# Patient Record
Sex: Female | Born: 1960 | Race: White | Hispanic: No | Marital: Married
Health system: Southern US, Community
[De-identification: ages and names within clinical notes are randomized; demographics above are authoritative.]

---

## 2019-10-19 ENCOUNTER — Other Ambulatory Visit: Payer: Self-pay | Admitting: Rheumatology

## 2019-10-19 DIAGNOSIS — R9389 Abnormal findings on diagnostic imaging of other specified body structures: Secondary | ICD-10-CM

## 2019-11-01 ENCOUNTER — Other Ambulatory Visit: Payer: Self-pay | Admitting: Rheumatology

## 2019-11-01 ENCOUNTER — Ambulatory Visit
Admission: RE | Admit: 2019-11-01 | Discharge: 2019-11-01 | Disposition: A | Discharge status: Home / Self Care | Payer: Commercial Managed Care - PPO | Source: Ambulatory Visit | Attending: Rheumatology | Admitting: Rheumatology

## 2019-11-01 ENCOUNTER — Other Ambulatory Visit: Payer: Self-pay

## 2019-11-01 DIAGNOSIS — R9389 Abnormal findings on diagnostic imaging of other specified body structures: Secondary | ICD-10-CM | POA: Insufficient documentation

## 2020-12-16 IMAGING — CT CT ABD-PELV W/O CM
2 of 4 series · 16 of 46 positions shown, 18 images · non-contrast
Comparison: None.

CLINICAL DATA: History of von Hippel-Lindau.

EXAM:
CT ABDOMEN AND PELVIS WITHOUT CONTRAST
TECHNIQUE: Multidetector CT imaging of the abdomen and pelvis was performed
following the standard protocol without IV contrast.

[Series 2: axials routine abdomen pelvis without 5.00 · axial · non-contrast · 0.91mm/px · z∈[-1519,-1099]mm · 13 of 98 slices shown, 15 images]
[im 7/98  soft-tissue]
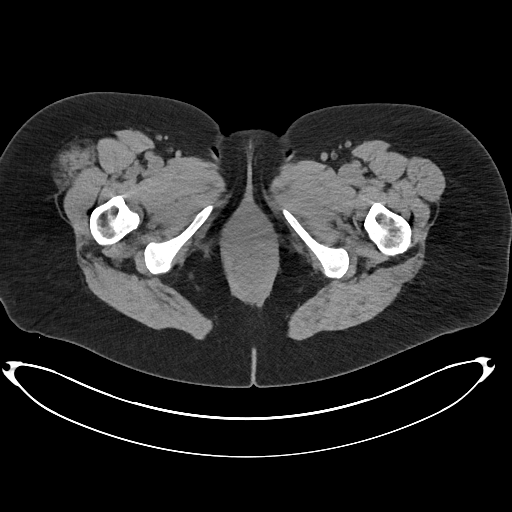
[im 7/98  bone]
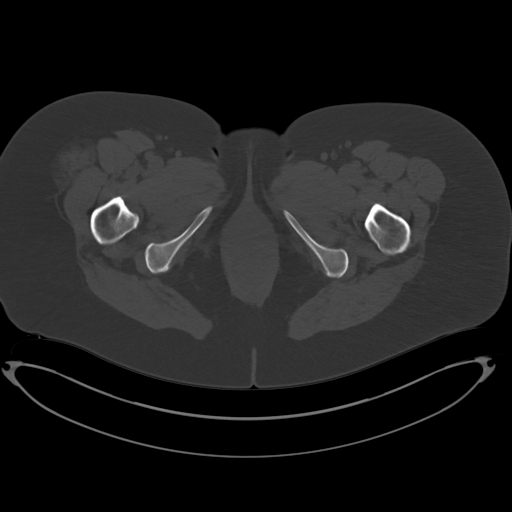
[im 13/98  soft-tissue]
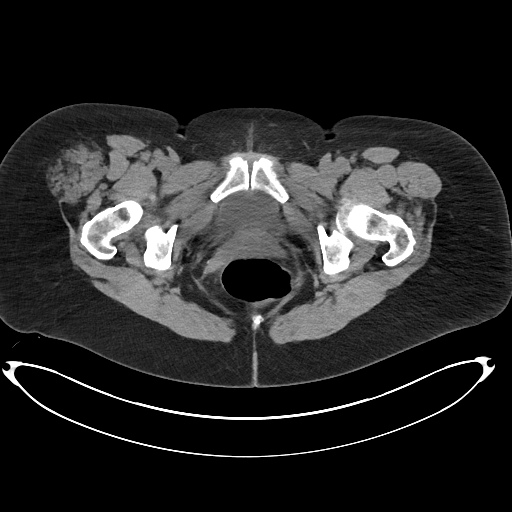
[im 20/98  soft-tissue]
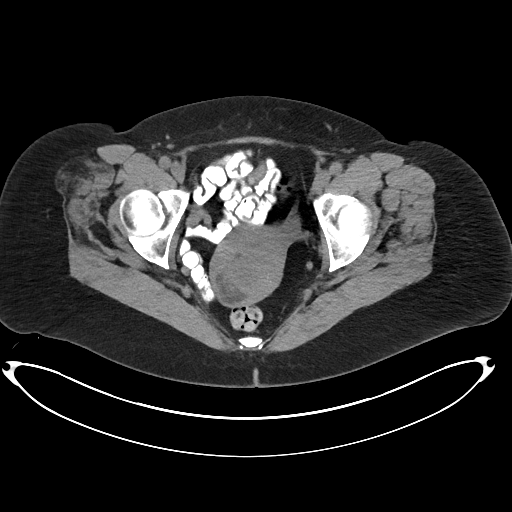
[im 26/98  soft-tissue]
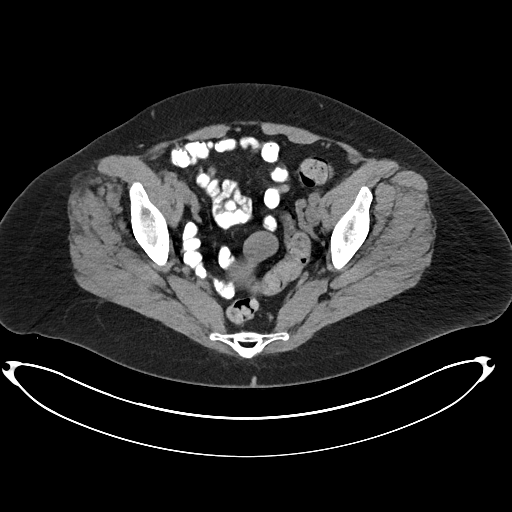
[im 33/98  soft-tissue]
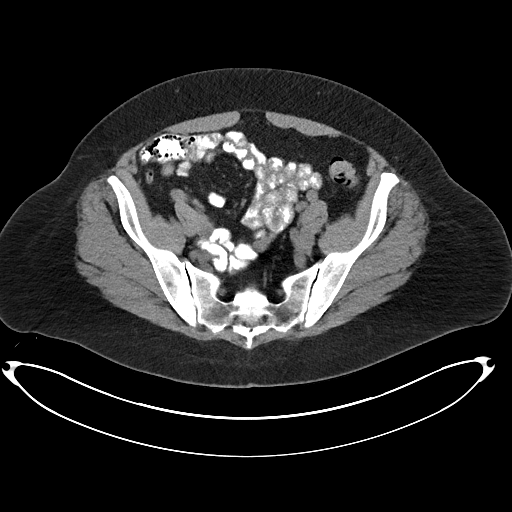
[im 39/98  soft-tissue]
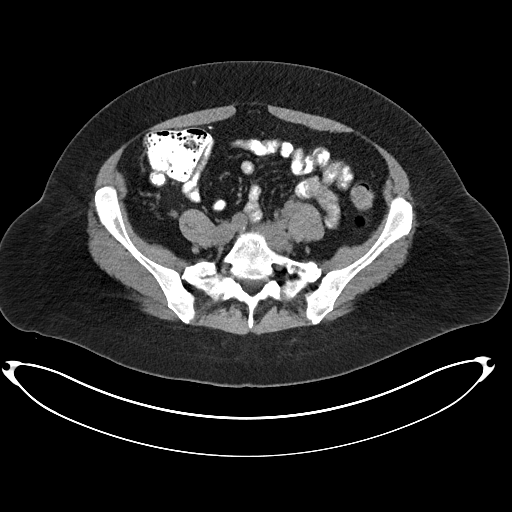
[im 52/98  soft-tissue]
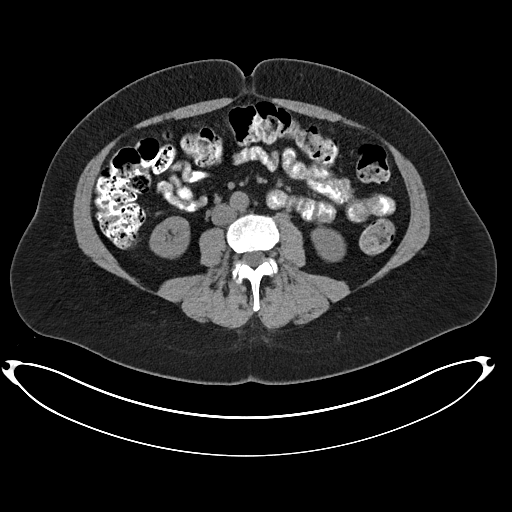
[im 59/98  soft-tissue]
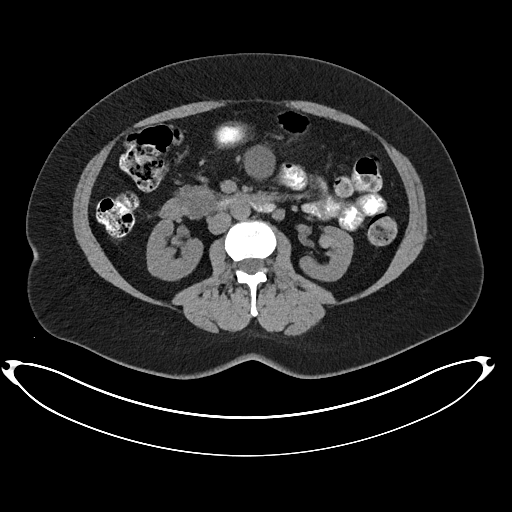
[im 65/98  soft-tissue]
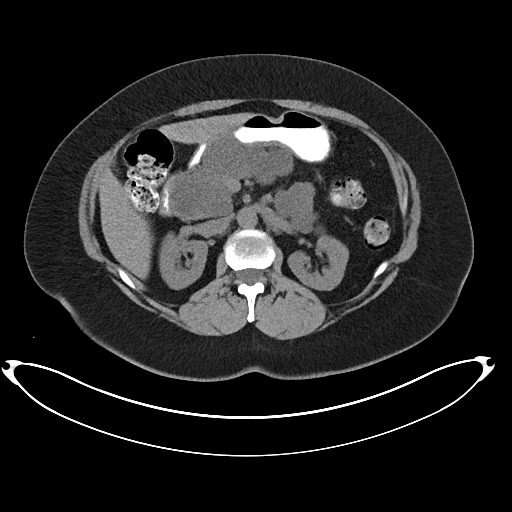
[im 65/98  bone]
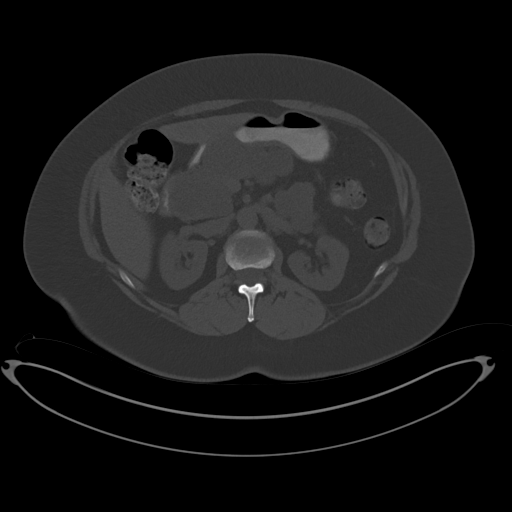
[im 72/98  soft-tissue]
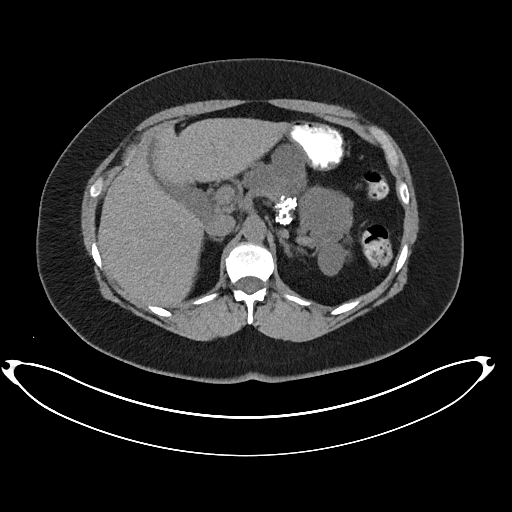
[im 78/98  soft-tissue]
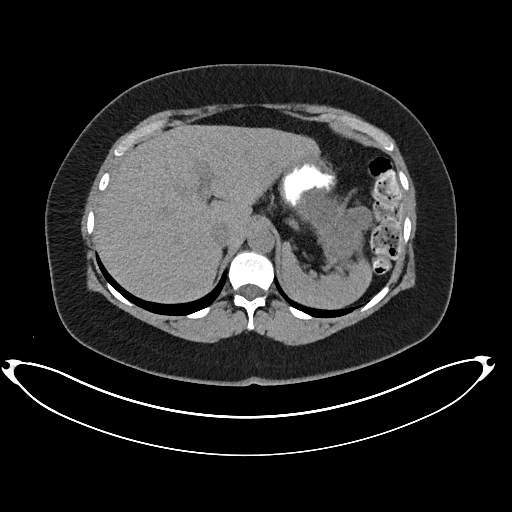
[im 85/98  soft-tissue]
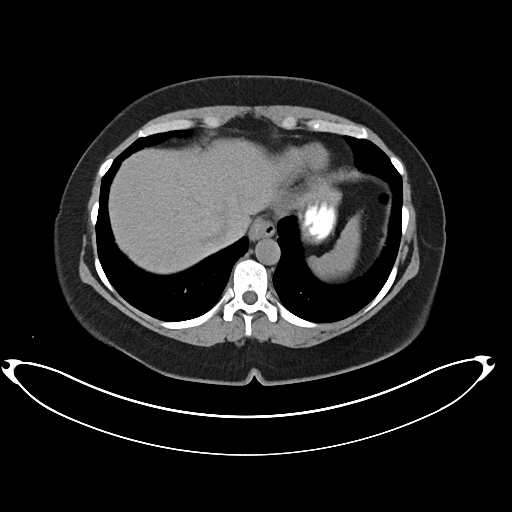
[im 91/98  soft-tissue]
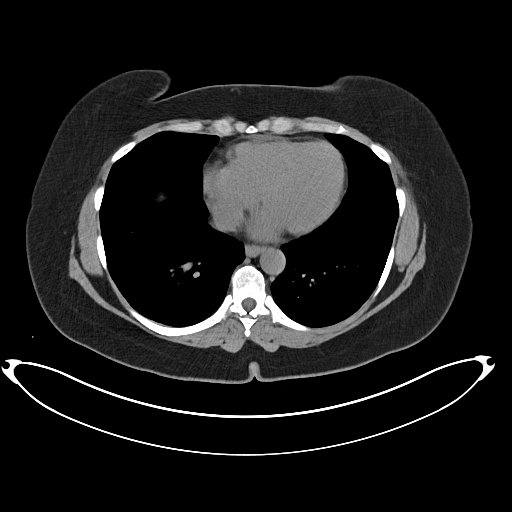

[Series 4: coronals routine abdomen pelvis without 2.00 cor · coronal · non-contrast · 0.91mm/px · 3 of 155 slices shown]
[im 52/155  soft-tissue]
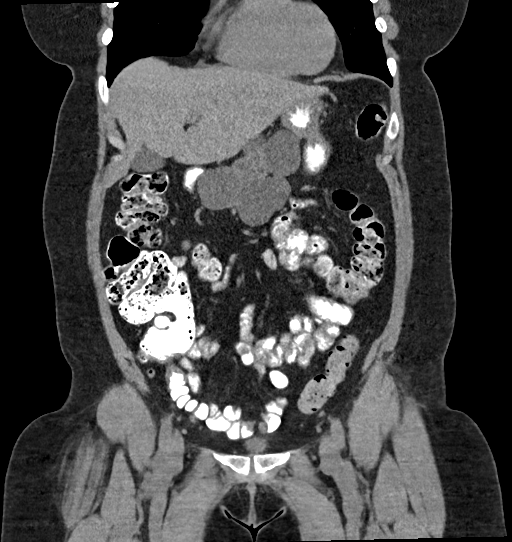
[im 69/155  soft-tissue]
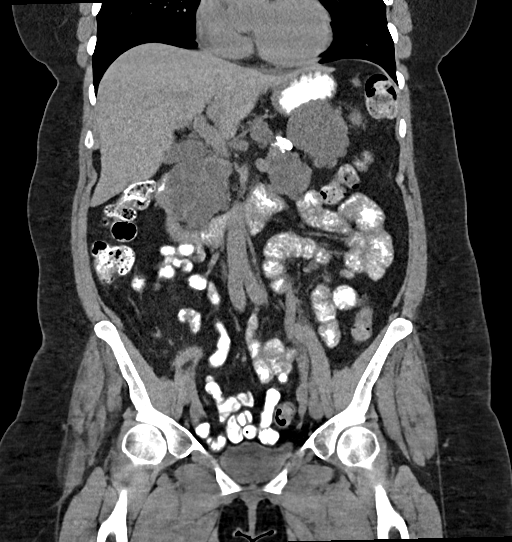
[im 86/155  soft-tissue]
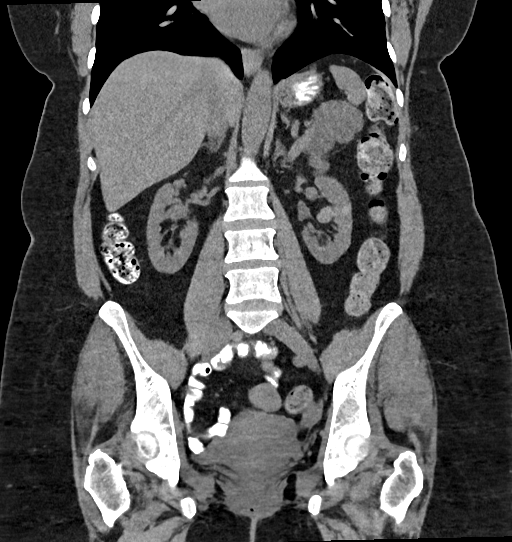

[16 of 46 positions shown; findings below may reference images not displayed]

FINDINGS: Lower chest: No acute abnormality.

Hepatobiliary: No focal liver abnormality identified. The
gallbladder appears normal. No biliary dilatation.

Pancreas: The normal pancreatic parenchyma has been completely
replaced by multiple cystic lesions. These measure up to 5.5 cm. A
focal area of calcifications within the tail of pancreas is also
identified measuring 2.5 cm.

Spleen: Spleen is unremarkable.

Adrenals/Urinary Tract: Normal appearance of the adrenal glands. The
right kidney is unremarkable. Several hyperdense left kidney lesions
are identified measuring up to 1.1 cm, image 40/2. incompletely
characterized without IV contrast.

Stomach/Bowel: Stomach is within normal limits. Appendix appears
normal. No evidence of bowel wall thickening, distention, or
inflammatory changes.

Vascular/Lymphatic: No significant vascular findings are present. No
enlarged abdominal or pelvic lymph nodes.

Reproductive: Mixed attenuation mass within the uterine fundus is
identified the borders of which are difficult to identified
measuring approximately 4.5 x 4.9 cm. Exophytic soft tissue nodule
arising from the fundus measures 3.2 cm and is favored to represent
a benign fibroid. No adnexal mass.

Other: No abdominal wall hernia or abnormality. No abdominopelvic
ascites.

Musculoskeletal: No acute or significant osseous findings.
IMPRESSION: 1. The pancreatic parenchyma is completely replaced by multiple
large cystic masses. Further, within the tail of pancreas there is a
focal area of calcification measuring 2.5 cm and corresponding to
the plain film radiographs from 10/14/2019. Imaging findings are
compatible with the clinical history of von Hippel-Lindau. Presence
of calcification within tail of pancreas is suggestive of pancreatic
serous cystadenoma. Advise further evaluation with contrast enhanced
pancreas protocol MRI.
2. There are several hyperdense lesions within the left kidney.
Findings may represent hemorrhagic cyst versus solid neoplasm. These
may also be more definitively characterized with contrast enhanced
MRI of the abdomen.
3. Indeterminate mixed attenuation lesion within the uterine fundus.
Advise further evaluation with pelvic sonogram.

## 2023-03-03 ENCOUNTER — Inpatient Hospital Stay: Payer: Medicare HMO | Attending: Internal Medicine | Admitting: Internal Medicine

## 2023-03-03 ENCOUNTER — Encounter: Payer: Self-pay | Admitting: Internal Medicine

## 2023-03-03 ENCOUNTER — Inpatient Hospital Stay: Payer: Medicare HMO

## 2023-03-03 VITALS — BP 99/65 | HR 81 | Temp 98.9°F

## 2023-03-03 DIAGNOSIS — Z79899 Other long term (current) drug therapy: Secondary | ICD-10-CM | POA: Insufficient documentation

## 2023-03-03 DIAGNOSIS — Q8583 Von Hippel-Lindau syndrome: Secondary | ICD-10-CM | POA: Insufficient documentation

## 2023-03-03 DIAGNOSIS — M069 Rheumatoid arthritis, unspecified: Secondary | ICD-10-CM | POA: Diagnosis not present

## 2023-03-03 DIAGNOSIS — D72820 Lymphocytosis (symptomatic): Secondary | ICD-10-CM | POA: Diagnosis not present

## 2023-03-03 DIAGNOSIS — Z8673 Personal history of transient ischemic attack (TIA), and cerebral infarction without residual deficits: Secondary | ICD-10-CM | POA: Insufficient documentation

## 2023-03-03 NOTE — Progress Notes (Signed)
[facility]  [phone] [phone]  ID: Betty Padilla OB: 22-Aug-1961  MR#: [id number]  AOZ#:[id number]  Patient Care Team: Dortha Kern, MD as PCP - General (Family Medicine)  REFERRING PROVIDER: Gerrie Nordmann, MD  REASON FOR REFERRAL: Lymphocytosis  HPI: Betty Padilla is a 62 y.o. 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  Patient reports feeling more tired for the past 2 days.  Otherwise denies any new symptoms.  Denies any fever, chills, lumps or bumps, night sweats, weight loss or changes in appetite. She follows with Dr. Allena Katz rheumatology at [facility].  Recently her medications were changed from Enbrel and methotrexate to Humira and hydroxychloroquine due to elevation in LFTs.  LFTs have normalized since.  CBC from 02/25/2023 showed WBC 9.1, hemoglobin 14.5, platelet 322, lymphocytes 5.74  CBC from 10/22/2022-WBC 7.7, lymphocytes 4.11  CBC from 07/16/2022-WBC 8.7, lymphocyte 3.65  Prior to that lymphocytes were normal.  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  It has been like that all her life.  She prefers getting blood work at [facility] at [employer].   REVIEW OF SYSTEMS:   ROS  As per HPI. Otherwise, a complete review of systems is negative.  PAST MEDICAL HISTORY: History reviewed. No pertinent past medical history.  PAST SURGICAL HISTORY: History reviewed. No pertinent surgical history.  FAMILY HISTORY: History reviewed. No pertinent family history.  HEALTH MAINTENANCE:     Not on File  No current outpatient medications on file.   No current facility-administered medications for this visit.    OBJECTIVE: Vitals:   03/03/23 1423  BP: 99/65  Pulse: 81   Temp: 98.9 F (37.2 C)  SpO2: 97%     There is no height or weight on file to calculate BMI.      General: Well-developed, well-nourished, no acute distress. Eyes: Pink conjunctiva, anicteric sclera. HEENT: Normocephalic, moist mucous membranes, clear oropharnyx. Lungs: Clear to auscultation bilaterally. Heart: Regular rate and rhythm. No rubs, murmurs, or gallops. Abdomen: Soft, nontender, nondistended. No organomegaly noted, normoactive bowel sounds. Musculoskeletal: No edema, cyanosis, or clubbing. Neuro: Alert, answering all questions appropriately. Cranial nerves grossly intact. Skin: No rashes or petechiae noted. Psych: Normal affect. Lymphatics: No cervical, calvicular, axillary or inguinal LAD.   LAB RESULTS:  No results found for: "NA", "K", "CL", "CO2", "GLUCOSE", "BUN", "CREATININE", "CALCIUM", "PROT", "ALBUMIN", "AST", "ALT", "ALKPHOS", "BILITOT", "GFRNONAA", "GFRAA"  No results found for: "WBC", "NEUTROABS", "HGB", "HCT", "MCV", "PLT"  No results found for: "TIBC", "FERRITIN", "IRONPCTSAT"   STUDIES: No results found.  ASSESSMENT AND PLAN:   Betty Padilla is a 62 y.o. 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  Will obtain flow cytometry.  Per 2018 iwCLL diagnostic criteria, it requires peripheral lymphocytosis greater than 5000 for at least 3 months.  Patient  is asymptomatic.  Exam was  unremarkable.  I briefly discussed the management that CLL is noted curable but treatable and we do not start treatment unless patient has symptoms or develops cytopenias or shorter doubling time of lymphocytes.  Otherwise it needs monitoring every 6 months.  Patient has phobia with blood draw and takes Valium prior.  Lab prescription was provided and she will obtain blood work at [facility] in [place].  I will follow-up with her via video visit in 2 weeks to discuss the lab results.  RTC in 2 weeks via video visit to discuss labs.  Patient expressed understanding and was in agreement with this plan. She also understands that She can call clinic at any time with any questions, concerns, or complaints.   I spent a total of 45 minutes reviewing chart data, face-to-face evaluation with the patient, counseling and coordination of care as detailed above.  Michaelyn Barter, MD   03/03/2023 3:14 PM

## 2023-03-03 NOTE — Progress Notes (Signed)
Patient had brain surgery back in 2021, and soon after she had a major stroke. She has a bad phobia with having her blood drawn/with needles period.

## 2023-03-17 ENCOUNTER — Inpatient Hospital Stay: Payer: Medicare HMO | Admitting: Internal Medicine
